# Patient Record
Sex: Male | Born: 1990 | Race: Black or African American | Hispanic: No | Marital: Single | Smoking: Never smoker
Health system: Southern US, Community
[De-identification: ages and names within clinical notes are randomized; demographics above are authoritative.]

## PROBLEM LIST (undated history)

## (undated) DIAGNOSIS — R011 Cardiac murmur, unspecified: Secondary | ICD-10-CM

## (undated) HISTORY — DX: Cardiac murmur, unspecified: R01.1

---
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
## 2008-08-06 ENCOUNTER — Emergency Department (HOSPITAL_COMMUNITY): Admission: EM | Admit: 2008-08-06 | Discharge: 2008-08-06 | Payer: Self-pay | Admitting: Emergency Medicine

## 2009-03-10 ENCOUNTER — Emergency Department (HOSPITAL_COMMUNITY): Admission: EM | Admit: 2009-03-10 | Discharge: 2009-03-10 | Payer: Self-pay | Admitting: Emergency Medicine

## 2009-08-22 ENCOUNTER — Emergency Department (HOSPITAL_COMMUNITY): Admission: EM | Admit: 2009-08-22 | Discharge: 2009-08-23 | Payer: Self-pay | Admitting: Emergency Medicine

## 2009-08-29 ENCOUNTER — Emergency Department (HOSPITAL_COMMUNITY): Admission: EM | Admit: 2009-08-29 | Discharge: 2009-08-30 | Payer: Self-pay | Admitting: Emergency Medicine

## 2009-10-26 ENCOUNTER — Emergency Department (HOSPITAL_COMMUNITY): Admission: EM | Admit: 2009-10-26 | Discharge: 2009-10-27 | Payer: Self-pay | Admitting: Emergency Medicine

## 2012-09-01 ENCOUNTER — Ambulatory Visit (INDEPENDENT_AMBULATORY_CARE_PROVIDER_SITE_OTHER): Payer: Managed Care, Other (non HMO) | Admitting: Orthopedic Surgery

## 2012-09-01 ENCOUNTER — Encounter: Payer: Self-pay | Admitting: Orthopedic Surgery

## 2012-09-01 ENCOUNTER — Ambulatory Visit (INDEPENDENT_AMBULATORY_CARE_PROVIDER_SITE_OTHER): Payer: Managed Care, Other (non HMO)

## 2012-09-01 VITALS — BP 120/78 | Ht 73.0 in | Wt 182.0 lb

## 2012-09-01 DIAGNOSIS — M25519 Pain in unspecified shoulder: Secondary | ICD-10-CM

## 2012-09-01 DIAGNOSIS — M25512 Pain in left shoulder: Secondary | ICD-10-CM

## 2012-09-01 MED ORDER — HYDROCODONE-ACETAMINOPHEN 5-325 MG PO TABS: 1.0000 | ORAL_TABLET | Freq: Four times a day (QID) | ORAL | Status: DC | PRN

## 2012-09-01 MED ORDER — IBUPROFEN 800 MG PO TABS: 800.0000 mg | ORAL_TABLET | Freq: Three times a day (TID) | ORAL | Status: DC

## 2012-09-01 MED ORDER — GABAPENTIN 100 MG PO CAPS: 100.0000 mg | ORAL_CAPSULE | Freq: Every day | ORAL | Status: DC

## 2012-09-01 NOTE — Progress Notes (Signed)
Patient ID: Jeremy Levine, male   DOB: 1990/12/07, 22 y.o.   MRN: [id number] Chief Complaint  Patient presents with  . Shoulder Pain    left shoulder pain. Consult by Dr. Ladona Ridgel   Pain located: left side of back and shoulder. Started 1 year ago Onset gradual Treatment 2 months of chiropractic therapy Symptoms Sharp, dull, throbbing Pain rating a 10 Pain is constant Improved by nothing Worse with use of left arm Other symptoms reported bruising, numbness, locking, catching, swelling  The past, family history and social history have been reviewed and are recorded in the corresponding sections of epic   BP 120/78  Ht 6\' 1"  (1.854 m)  Wt 182 lb (82.555 kg)  BMI 24.02 kg/m2 General appearance is normal, the patient is alert and oriented x3 with normal mood and affect. Ambulation is normal. The patient's body habitus is muscular.  Right shoulder is tender over the superior angle of the scapula. He stable in abduction external rotation his rotator cuff strength is grade 5 he has normal passive range of motion he may have some mild winging of the inferior border of the scapula. There is some tenderness in the cervical spine but no loss of motion. Skin is without laceration pulses are normal lymph nodes are negative sensation is intact no pathologic reflexes  X-rays are normal  I reviewed Dr. Lubertha Basque notes  He has a trigger point  Recommend injection.  Recommend hydrocodone, gabapentin, ibuprofen, return 2 weeks to repeat injection if needed  Encounter Diagnoses  Name Primary?  . Left shoulder pain   . Trigger point of left shoulder region Yes

## 2012-09-01 NOTE — Patient Instructions (Addendum)
You Can Quit Smoking If you are ready to quit smoking or are thinking about it, congratulations! You have chosen to help yourself be healthier and live longer! There are lots of different ways to quit smoking. Nicotine gum, nicotine patches, a nicotine inhaler, or nicotine nasal spray can help with physical craving. Hypnosis, support groups, and medicines help break the habit of smoking. TIPS TO GET OFF AND STAY OFF CIGARETTES  Learn to predict your moods. Do not let a bad situation be your excuse to have a cigarette. Some situations in your life might tempt you to have a cigarette.  Ask friends and co-workers not to smoke around you.  Make your home smoke-free.  Never have "just one" cigarette. It leads to wanting another and another. Remind yourself of your decision to quit.  On a card, make a list of your reasons for not smoking. Read it at least the same number of times a day as you have a cigarette. Tell yourself everyday, "I do not want to smoke. I choose not to smoke."  Ask someone at home or work to help you with your plan to quit smoking.  Have something planned after you eat or have a cup of coffee. Take a walk or get other exercise to perk you up. This will help to keep you from overeating.  Try a relaxation exercise to calm you down and decrease your stress. Remember, you may be tense and nervous the first two weeks after you quit. This will pass.  Find new activities to keep your hands busy. Play with a pen, coin, or rubber band. Doodle or draw things on paper.  Brush your teeth right after eating. This will help cut down the craving for the taste of tobacco after meals. You can try mouthwash too.  Try gum, breath mints, or diet candy to keep something in your mouth. IF YOU SMOKE AND WANT TO QUIT:  Do not stock up on cigarettes. Never buy a carton. Wait until one pack is finished before you buy another.  Never carry cigarettes with you at work or at home.  Keep cigarettes  as far away from you as possible. Leave them with someone else.  Never carry matches or a lighter with you.  Ask yourself, "Do I need this cigarette or is this just a reflex?"  Bet with someone that you can quit. Put cigarette money in a piggy bank every morning. If you smoke, you give up the money. If you do not smoke, by the end of the week, you keep the money.  Keep trying. It takes 21 days to change a habit!  Talk to your doctor about using medicines to help you quit. These include nicotine replacement gum, lozenges, or skin patches. Document Released: 11/23/2008 Document Revised: 04/21/2011 Document Reviewed: 11/23/2008 [employer] Patient Information 2014 [employer].  

## 2012-09-16 ENCOUNTER — Ambulatory Visit: Payer: Managed Care, Other (non HMO) | Admitting: Orthopedic Surgery

## 2012-09-30 ENCOUNTER — Ambulatory Visit: Payer: Managed Care, Other (non HMO) | Admitting: Orthopedic Surgery

## 2012-10-05 ENCOUNTER — Encounter: Payer: Self-pay | Admitting: Orthopedic Surgery

## 2012-10-05 ENCOUNTER — Ambulatory Visit (INDEPENDENT_AMBULATORY_CARE_PROVIDER_SITE_OTHER): Payer: Managed Care, Other (non HMO) | Admitting: Orthopedic Surgery

## 2012-10-05 VITALS — BP 117/75 | Ht 73.0 in | Wt 182.0 lb

## 2012-10-05 DIAGNOSIS — M25519 Pain in unspecified shoulder: Secondary | ICD-10-CM

## 2012-10-05 DIAGNOSIS — M25512 Pain in left shoulder: Secondary | ICD-10-CM

## 2012-10-05 MED ORDER — HYDROCODONE-ACETAMINOPHEN 5-325 MG PO TABS: 1.0000 | ORAL_TABLET | Freq: Four times a day (QID) | ORAL | Status: DC | PRN

## 2012-10-05 NOTE — Patient Instructions (Signed)
You have received a steroid shot. 15% of patients experience increased pain at the injection site with in the next 24 hours. This is best treated with ice and tylenol extra strength 2 tabs every 8 hours. If you are still having pain please call the office.   Trigger point Trigger Point Injection Trigger points are areas where you have muscle pain. A trigger point injection is a shot given in the trigger point to relieve that pain. A trigger point might feel like a knot in your muscle. It hurts to press on a trigger point. Sometimes the pain spreads out (radiates) to other parts of the body. For example, pressing on a trigger point in your shoulder might cause pain in your arm or neck. You might have one trigger point. Or, you might have more than one. People often have trigger points in their upper back and lower back. They also occur often in the neck and shoulders. Pain from a trigger point lasts for a long time. It can make it hard to keep moving. You might not be able to do the exercise or physical therapy that could help you deal with the pain. A trigger point injection may help. It does not work for everyone. But, it may relieve your pain for a few days or a few months. A trigger point injection does not cure long-lasting (chronic) pain. LET YOUR CAREGIVER KNOW ABOUT:  Any allergies (especially to latex, lidocaine, or steroids).  Blood-thinning medicines that you take. These drugs can lead to bleeding or bruising after an injection. They include:  Aspirin.  Ibuprofen.  Clopidogrel.  Warfarin.  Other medicines you take. This includes all vitamins, herbs, eyedrops, over-the-counter medicines, and creams.  Use of steroids.  Recent infections.  Past problems with numbing medicines.  Bleeding problems.  Surgeries you have had.  Other health problems. RISKS AND COMPLICATIONS A trigger point injection is a safe treatment. However, problems may develop, such as:  Minor side effects  usually go away in 1 to 2 days. These may include:  Soreness.  Bruising.  Stiffness.  More serious problems are rare. But, they may include:  Bleeding under the skin (hematoma).  Skin infection.  Breaking off of the needle under your skin.  Lung puncture.  The trigger point injection may not work for you. BEFORE THE PROCEDURE You may need to stop taking any medicine that thins your blood. This is to prevent bleeding and bruising. Usually these medicines are stopped several days before the injection. No other preparation is needed. PROCEDURE  A trigger point injection can be given in your caregiver's office or in a clinic. Each injection takes 2 minutes or less.  Your caregiver will feel for trigger points. The caregiver may use a marker to circle the area for the injection.  The skin over the trigger point will be washed with a germ-killing (antiseptic) solution.  The caregiver pinches the spot for the injection.  Then, a very thin needle is used for the shot. You may feel pain or a twitching feeling when the needle enters the trigger point.  A numbing solution may be injected into the trigger point. Sometimes a drug to keep down swelling, redness, and warmth (inflammation) is also injected.  Your caregiver moves the needle around the trigger zone until the tightness and twitching goes away.  After the injection, your caregiver may put gentle pressure over the injection site.  Then it is covered with a bandage. AFTER THE PROCEDURE  You can go  right home after the injection.  The bandage can be taken off after a few hours.  You may feel sore and stiff for 1 to 2 days.  Go back to your regular activities slowly. Your caregiver may ask you to stretch your muscles. Do not do anything that takes extra energy for a few days.  Follow your caregiver's instructions to manage and treat other pain. Document Released: 01/16/2011 Document Revised: 04/21/2011 Document Reviewed:  01/16/2011 [facility] Patient Information 2014 [place], [place].

## 2012-10-05 NOTE — Progress Notes (Signed)
Patient ID: Jeremy Levine, male   DOB: 1990/02/23, 22 y.o.   MRN: [id number]  Chief Complaint  Patient presents with  . Follow-up    2 week recheck for trigger point left shoulder.    Repeat examination in evaluation for pain at the superior border and angle of the left scapula with some radiation into the cervical spine and left trapezius muscle. The patient did get relief for 2 weeks with the injection comes back for reevaluation with recurrent pain  Denies shoulder pain or pain and numbness or tingling in the left arm  General appearance is normal, the patient is alert and oriented x3 with normal mood and affect. BP 117/75  Ht 6\' 1"  (1.854 m)  Wt 182 lb (82.555 kg)  BMI 24.02 kg/m2  Tender in the left tract and left superior border of the scapula at the angle. Shoulder range of motion neck range of motion normal no tenderness in the C-spine  Repeat trigger point injection  Verbal consent time out to confirm surgical site:  Left shoulder superior angle of the scapula point of maximal tenderness prepped with alcohol and ethyl chloride injected with Depo-Medrol and lidocaine. Meds 40 mg of Depo-Medrol and lidocaine 1% 3 cc  Continue current medications Norco refilled followup 2-3 weeks

## 2012-10-21 ENCOUNTER — Ambulatory Visit (INDEPENDENT_AMBULATORY_CARE_PROVIDER_SITE_OTHER): Payer: Managed Care, Other (non HMO) | Admitting: Orthopedic Surgery

## 2012-10-21 VITALS — BP 125/75 | Ht 73.0 in | Wt 182.0 lb

## 2012-10-21 DIAGNOSIS — M25519 Pain in unspecified shoulder: Secondary | ICD-10-CM

## 2012-10-21 DIAGNOSIS — M25512 Pain in left shoulder: Secondary | ICD-10-CM

## 2012-10-21 MED ORDER — HYDROCODONE-ACETAMINOPHEN 5-325 MG PO TABS: 1.0000 | ORAL_TABLET | Freq: Four times a day (QID) | ORAL | Status: DC | PRN

## 2012-10-21 NOTE — Patient Instructions (Addendum)
You have received a steroid shot. 15% of patients experience increased pain at the injection site with in the next 24 hours. This is best treated with ice and tylenol extra strength 2 tabs every 8 hours. If you are still having pain please call the office.    

## 2012-10-21 NOTE — Progress Notes (Signed)
Patient ID: Jeremy Levine, male   DOB: 11/13/1990, 23 y.o.   MRN: [id number] Chief Complaint  Patient presents with  . Follow-up    2 week recheck left shoulder    The pain is now localized to the left trapezius muscle.  No pain with activities related to the shoulder itself  General appearance is normal, the patient is alert and oriented x3 with normal mood and affect.   BP 125/75  Ht 6\' 1"  (1.854 m)  Wt 182 lb (82.555 kg)  BMI 24.02 kg/m2 For elevation remains normal strength is excellent he has tenderness over the trapezius muscle in the suprascapular fossa without numbness or tingling axillary nodes are negative supraclavicular nodes are negative  Trigger point injection left trapezius muscle. DX trigger point Med Depo-Medrol 40 mg per mL, 1 cc         Lidocaine 1% 3 cc  Alcohol and ethyl chloride were used to prep Verbal consent and timeout to confirm surgical site   The point of maximal tenderness was injected with 25-gauge needle no complications

## 2012-11-04 ENCOUNTER — Encounter: Payer: Self-pay | Admitting: Orthopedic Surgery

## 2012-11-04 ENCOUNTER — Ambulatory Visit: Payer: Managed Care, Other (non HMO) | Admitting: Orthopedic Surgery

## 2012-12-14 ENCOUNTER — Encounter: Payer: Self-pay | Admitting: Orthopedic Surgery

## 2012-12-14 ENCOUNTER — Ambulatory Visit: Payer: Managed Care, Other (non HMO) | Admitting: Orthopedic Surgery

## 2017-12-09 ENCOUNTER — Emergency Department (HOSPITAL_COMMUNITY)
Admission: EM | Admit: 2017-12-09 | Discharge: 2017-12-10 | Payer: No Typology Code available for payment source | Attending: Emergency Medicine | Admitting: Emergency Medicine

## 2017-12-09 ENCOUNTER — Encounter (HOSPITAL_COMMUNITY): Payer: Self-pay | Admitting: Emergency Medicine

## 2017-12-09 ENCOUNTER — Emergency Department (HOSPITAL_COMMUNITY): Payer: No Typology Code available for payment source

## 2017-12-09 ENCOUNTER — Other Ambulatory Visit: Payer: Self-pay

## 2017-12-09 DIAGNOSIS — S0181XA Laceration without foreign body of other part of head, initial encounter: Secondary | ICD-10-CM | POA: Diagnosis not present

## 2017-12-09 DIAGNOSIS — Y999 Unspecified external cause status: Secondary | ICD-10-CM | POA: Insufficient documentation

## 2017-12-09 DIAGNOSIS — F17210 Nicotine dependence, cigarettes, uncomplicated: Secondary | ICD-10-CM | POA: Insufficient documentation

## 2017-12-09 DIAGNOSIS — S43005A Unspecified dislocation of left shoulder joint, initial encounter: Secondary | ICD-10-CM | POA: Insufficient documentation

## 2017-12-09 DIAGNOSIS — S0101XA Laceration without foreign body of scalp, initial encounter: Secondary | ICD-10-CM | POA: Insufficient documentation

## 2017-12-09 DIAGNOSIS — Y9389 Activity, other specified: Secondary | ICD-10-CM | POA: Insufficient documentation

## 2017-12-09 DIAGNOSIS — Z23 Encounter for immunization: Secondary | ICD-10-CM | POA: Diagnosis not present

## 2017-12-09 DIAGNOSIS — Y929 Unspecified place or not applicable: Secondary | ICD-10-CM | POA: Diagnosis not present

## 2017-12-09 LAB — CBC WITH DIFFERENTIAL/PLATELET
ABS IMMATURE GRANULOCYTES: 0.07 10*3/uL (ref 0.00–0.07)
Basophils Absolute: 0.1 10*3/uL (ref 0.0–0.1)
Basophils Relative: 1 %
EOS PCT: 2 %
Eosinophils Absolute: 0.3 10*3/uL (ref 0.0–0.5)
HEMATOCRIT: 40.8 % (ref 39.0–52.0)
HEMOGLOBIN: 12.5 g/dL — AB (ref 13.0–17.0)
Immature Granulocytes: 1 %
LYMPHS ABS: 3.6 10*3/uL (ref 0.7–4.0)
LYMPHS PCT: 25 %
MCH: 26.1 pg (ref 26.0–34.0)
MCHC: 30.6 g/dL (ref 30.0–36.0)
MCV: 85.2 fL (ref 80.0–100.0)
MONO ABS: 0.9 10*3/uL (ref 0.1–1.0)
MONOS PCT: 6 %
NEUTROS ABS: 9.4 10*3/uL — AB (ref 1.7–7.7)
NEUTROS PCT: 65 %
Platelets: 352 10*3/uL (ref 150–400)
RBC: 4.79 MIL/uL (ref 4.22–5.81)
RDW: 12 % (ref 11.5–15.5)
WBC: 14.2 10*3/uL — AB (ref 4.0–10.5)
nRBC: 0 % (ref 0.0–0.2)

## 2017-12-09 LAB — BASIC METABOLIC PANEL
Anion gap: 7 (ref 5–15)
BUN: 7 mg/dL (ref 6–20)
CHLORIDE: 105 mmol/L (ref 98–111)
CO2: 25 mmol/L (ref 22–32)
Calcium: 9.5 mg/dL (ref 8.9–10.3)
Creatinine, Ser: 1.24 mg/dL (ref 0.61–1.24)
GFR calc Af Amer: >60 mL/min (ref >60)
GFR calc non Af Amer: >60 mL/min (ref >60)
Glucose, Bld: 165 mg/dL — ABNORMAL HIGH (ref 70–99)
POTASSIUM: 3.3 mmol/L — AB (ref 3.5–5.1)
SODIUM: 137 mmol/L (ref 135–145)

## 2017-12-09 MED ORDER — LIDOCAINE-EPINEPHRINE (PF) 2 %-1:200000 IJ SOLN
20.0000 mL | Freq: Once | INTRAMUSCULAR | Status: DC
  Filled 2017-12-09: qty 20

## 2017-12-09 MED ORDER — FENTANYL CITRATE (PF) 100 MCG/2ML IJ SOLN
50.0000 ug | Freq: Once | INTRAMUSCULAR | Status: DC
  Administered 2017-12-09: 50 ug via INTRAVENOUS
  Filled 2017-12-09: qty 2

## 2017-12-09 MED ORDER — PROPOFOL 10 MG/ML IV BOLUS
0.5000 mg/kg | Freq: Once | INTRAVENOUS | Status: DC
  Filled 2017-12-09: qty 20

## 2017-12-09 MED ORDER — TETANUS-DIPHTH-ACELL PERTUSSIS 5-2.5-18.5 LF-MCG/0.5 IM SUSP
0.5000 mL | Freq: Once | INTRAMUSCULAR | Status: AC
  Administered 2017-12-09: 0.5 mL via INTRAMUSCULAR
  Filled 2017-12-09: qty 0.5

## 2017-12-09 MED ORDER — LORAZEPAM 2 MG/ML IJ SOLN
1.0000 mg | Freq: Once | INTRAMUSCULAR | Status: AC
  Administered 2017-12-09: 1 mg via INTRAVENOUS
  Filled 2017-12-09: qty 1

## 2017-12-09 MED ORDER — HYDROMORPHONE HCL 1 MG/ML IJ SOLN
1.0000 mg | Freq: Once | INTRAMUSCULAR | Status: AC
  Administered 2017-12-09: 1 mg via INTRAVENOUS
  Filled 2017-12-09: qty 1
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
## 2017-12-10 ENCOUNTER — Emergency Department (HOSPITAL_COMMUNITY): Payer: No Typology Code available for payment source

## 2017-12-10 MED ORDER — CEPHALEXIN 500 MG PO CAPS: 500.0000 mg | ORAL_CAPSULE | Freq: Three times a day (TID) | ORAL | 0 refills | Status: DC

## 2017-12-10 MED ORDER — IBUPROFEN 800 MG PO TABS: 800.0000 mg | ORAL_TABLET | Freq: Three times a day (TID) | ORAL | 0 refills | Status: DC | PRN

## 2017-12-10 MED ORDER — BACITRACIN ZINC 500 UNIT/GM EX OINT: TOPICAL_OINTMENT | Freq: Once | CUTANEOUS | Status: DC

## 2017-12-10 MED ORDER — PROPOFOL 10 MG/ML IV BOLUS
INTRAVENOUS | Status: AC | PRN
  Administered 2017-12-10: 50 mg via INTRAVENOUS
  Administered 2017-12-10: 30 mg via INTRAVENOUS
  Administered 2017-12-10: 20 mg via INTRAVENOUS
  Administered 2017-12-10: 50 mg via INTRAVENOUS

## 2017-12-10 NOTE — ED Notes (Signed)
  Patient tolerated PO challenge and standing up at the bedside.  Patient was placed in blue scrubs and L shoulder sling.  Patient belongings given to [facility] for transport.

## 2017-12-10 NOTE — ED Provider Notes (Signed)
.Sedation Date/Time: 12/10/2017 10:49 AM Performed by: Heide Scales, MD Authorized by: Heide Scales, MD   Consent:    Consent obtained:  Verbal   Consent given by:  Patient   Risks discussed:  Allergic reaction, dysrhythmia, inadequate sedation, nausea, prolonged hypoxia resulting in organ damage, prolonged sedation necessitating reversal, respiratory compromise necessitating ventilatory assistance and intubation and vomiting   Alternatives discussed:  Analgesia without sedation and anxiolysis Universal protocol:    Procedure explained and questions answered to patient or proxy's satisfaction: yes     Relevant documents present and verified: yes     Test results available and properly labeled: yes     Imaging studies available: yes     Required blood products, implants, devices, and special equipment available: yes     Site/side marked: yes     Immediately prior to procedure a time out was called: yes     Patient identity confirmation method:  Verbally with patient Indications:    Procedure performed:  Dislocation reduction   Procedure necessitating sedation performed by:  Physician performing sedation Pre-sedation assessment:    Time since last food or drink:  Over 1 hour   ASA classification: class 1 - normal, healthy patient     Neck mobility: normal     Mouth opening:  3 or more finger widths   Thyromental distance:  4 finger widths   Mallampati score:  I - soft palate, uvula, fauces, pillars visible   Pre-sedation assessments completed and reviewed: airway patency, cardiovascular function, hydration status, mental status, nausea/vomiting, pain level, respiratory function and temperature   Immediate pre-procedure details:    Reassessment: Patient reassessed immediately prior to procedure     Reviewed: vital signs, relevant labs/tests and NPO status     Verified: bag valve mask available, emergency equipment available, intubation equipment available, IV patency  confirmed, oxygen available and suction available   Procedure details (see MAR for exact dosages):    Preoxygenation:  Nasal cannula   Sedation:  Propofol   Analgesia:  Hydromorphone   Intra-procedure monitoring:  Blood pressure monitoring, cardiac monitor, continuous pulse oximetry, frequent LOC assessments, frequent vital sign checks and continuous capnometry   Intra-procedure events: none     Total Provider sedation time (minutes):  30 Post-procedure details:    Attendance: Constant attendance by certified staff until patient recovered     Recovery: Patient returned to pre-procedure baseline     Post-sedation assessments completed and reviewed: airway patency, cardiovascular function, hydration status, mental status, nausea/vomiting, pain level, respiratory function and temperature     Patient is stable for discharge or admission: yes     Patient tolerance:  Tolerated well, no immediate complications Reduction of dislocation Date/Time: 12/10/2017 10:51 AM Performed by: Heide Scales, MD Authorized by: Heide Scales, MD  Consent: Written consent obtained. Risks and benefits: risks, benefits and alternatives were discussed Consent given by: patient Patient understanding: patient states understanding of the procedure being performed Patient consent: the patient's understanding of the procedure matches consent given Procedure consent: procedure consent matches procedure scheduled Relevant documents: relevant documents present and verified Test results: test results available and properly labeled Imaging studies: imaging studies available Patient identity confirmed: verbally with patient and arm band Time out: Immediately prior to procedure a "time out" was called to verify the correct patient, procedure, equipment, support staff and site/side marked as required.  Sedation: Patient sedated: yes Vitals: Vital signs were monitored during sedation.  Patient tolerance:  Patient tolerated the procedure  well with no immediate complications Comments: Successful shoulder reduction with propofol sedation.         Soffia Doshier, Canary Brim, MD 12/10/17 1051
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
## 2017-12-10 NOTE — Discharge Instructions (Addendum)
It was my pleasure taking care of you today!   Keep your wounds clean and dry. Follow up with the plastic surgery team for further evaluation of your lacerations. You will need to have these stitches removed. If you cannot follow up with the plastics team for any reason, return to ER in 7 days for suture / staple removal.   Please take all of your antibiotics until finished!  This is to prevent infection.   Return to ER for new or worsening symptoms, any additional concerns.

## 2017-12-23 ENCOUNTER — Emergency Department (HOSPITAL_COMMUNITY)
Admission: EM | Admit: 2017-12-23 | Discharge: 2017-12-23 | Payer: Self-pay | Attending: Emergency Medicine | Admitting: Emergency Medicine

## 2017-12-23 ENCOUNTER — Other Ambulatory Visit: Payer: Self-pay

## 2017-12-23 ENCOUNTER — Ambulatory Visit (INDEPENDENT_AMBULATORY_CARE_PROVIDER_SITE_OTHER): Payer: Self-pay

## 2017-12-23 ENCOUNTER — Emergency Department (HOSPITAL_COMMUNITY): Payer: Self-pay

## 2017-12-23 ENCOUNTER — Encounter (INDEPENDENT_AMBULATORY_CARE_PROVIDER_SITE_OTHER): Payer: Self-pay | Admitting: Orthopedic Surgery

## 2017-12-23 ENCOUNTER — Ambulatory Visit (INDEPENDENT_AMBULATORY_CARE_PROVIDER_SITE_OTHER): Payer: Self-pay | Admitting: Orthopedic Surgery

## 2017-12-23 DIAGNOSIS — M25512 Pain in left shoulder: Secondary | ICD-10-CM

## 2017-12-23 DIAGNOSIS — F17200 Nicotine dependence, unspecified, uncomplicated: Secondary | ICD-10-CM | POA: Insufficient documentation

## 2017-12-23 DIAGNOSIS — S43015D Anterior dislocation of left humerus, subsequent encounter: Secondary | ICD-10-CM | POA: Insufficient documentation

## 2017-12-23 DIAGNOSIS — X500XXD Overexertion from strenuous movement or load, subsequent encounter: Secondary | ICD-10-CM | POA: Insufficient documentation

## 2017-12-23 DIAGNOSIS — S43015A Anterior dislocation of left humerus, initial encounter: Secondary | ICD-10-CM

## 2017-12-23 MED ORDER — SODIUM CHLORIDE 0.9 % IV BOLUS
1000.0000 mL | Freq: Once | INTRAVENOUS | Status: AC
  Administered 2017-12-23: 1000 mL via INTRAVENOUS

## 2017-12-23 MED ORDER — IBUPROFEN 800 MG PO TABS: 800.0000 mg | ORAL_TABLET | Freq: Three times a day (TID) | ORAL | 0 refills | Status: DC | PRN

## 2017-12-23 MED ORDER — PROPOFOL 10 MG/ML IV BOLUS
1.0000 mg/kg | Freq: Once | INTRAVENOUS | Status: AC
  Administered 2017-12-23: 95.3 mg via INTRAVENOUS
  Filled 2017-12-23: qty 20

## 2017-12-23 MED ORDER — LORAZEPAM 2 MG/ML IJ SOLN
1.0000 mg | Freq: Once | INTRAMUSCULAR | Status: AC
  Administered 2017-12-23: 1 mg via INTRAVENOUS
  Filled 2017-12-23: qty 1

## 2017-12-23 MED ORDER — IBUPROFEN 800 MG PO TABS
800.0000 mg | ORAL_TABLET | Freq: Once | ORAL | Status: AC
  Administered 2017-12-23: 800 mg via ORAL
  Filled 2017-12-23: qty 1

## 2017-12-23 MED ORDER — PROPOFOL 10 MG/ML IV BOLUS
1.0000 mg/kg | Freq: Once | INTRAVENOUS | Status: AC
  Administered 2017-12-23: 95.3 mg via INTRAVENOUS

## 2017-12-23 MED ORDER — PROPOFOL 10 MG/ML IV BOLUS
INTRAVENOUS | Status: AC
  Administered 2017-12-23: 95.3 mg via INTRAVENOUS
  Filled 2017-12-23: qty 20

## 2017-12-23 MED ORDER — HYDROMORPHONE HCL 1 MG/ML IJ SOLN
1.0000 mg | Freq: Once | INTRAMUSCULAR | Status: AC
  Administered 2017-12-23: 1 mg via INTRAVENOUS
  Filled 2017-12-23: qty 1

## 2017-12-23 MED ORDER — LORAZEPAM 2 MG/ML IJ SOLN: 1.0000 mg | Freq: Once | INTRAMUSCULAR | Status: DC

## 2017-12-23 NOTE — Progress Notes (Signed)
Conscience sedation procedure completed.  Pt's vitals remained stable.
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
## 2017-12-23 NOTE — ED Notes (Signed)
Patient transported to MRI 

## 2017-12-23 NOTE — Discharge Instructions (Signed)
Please call and follow up with orthopedist Dr. August Saucerean for result of shoulder MRI and for further management.  Take ibuprofen as needed for pain. Return if your condition worsen.  Wear sling as provided.
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
## 2017-12-23 NOTE — ED Notes (Signed)
Discharge instructions discussed with Pt. Pt verbalized understanding. Pt stable and ambulatory, leaving in police custody.
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
## 2017-12-23 NOTE — ED Notes (Signed)
Informed consent for left shoulder reduction signed and placed in medical records.

## 2017-12-23 NOTE — ED Provider Notes (Signed)
.  Sedation Date/Time: 12/23/2017 2:32 PM Performed by: Pricilla LovelessGoldston, Brainard Highfill, MD Authorized by: Pricilla LovelessGoldston, Kiera Hussey, MD   Consent:    Consent obtained:  Verbal   Consent given by:  Patient   Risks discussed:  Allergic reaction, dysrhythmia, inadequate sedation, nausea, prolonged hypoxia resulting in organ damage, prolonged sedation necessitating reversal, respiratory compromise necessitating ventilatory assistance and intubation and vomiting   Alternatives discussed:  Analgesia without sedation Universal protocol:    Procedure explained and questions answered to patient or proxy's satisfaction: yes     Relevant documents present and verified: yes     Test results available and properly labeled: yes     Imaging studies available: yes     Required blood products, implants, devices, and special equipment available: yes     Site/side marked: yes     Immediately prior to procedure a time out was called: yes     Patient identity confirmation method:  Verbally with patient Indications:    Procedure necessitating sedation performed by:  Physician performing sedation Pre-sedation assessment:    Time since last food or drink:  This AM   ASA classification: class 1 - normal, healthy patient     Neck mobility: normal     Mouth opening:  3 or more finger widths   Thyromental distance:  4 finger widths   Mallampati score:  I - soft palate, uvula, fauces, pillars visible   Pre-sedation assessments completed and reviewed: airway patency, cardiovascular function, hydration status, mental status, nausea/vomiting, pain level, respiratory function and temperature   Immediate pre-procedure details:    Reassessment: Patient reassessed immediately prior to procedure     Reviewed: vital signs, relevant labs/tests and NPO status     Verified: bag valve mask available, emergency equipment available, intubation equipment available, IV patency confirmed, oxygen available and suction available   Procedure details (see  MAR for exact dosages):    Preoxygenation:  Nasal cannula   Sedation:  Propofol   Intra-procedure monitoring:  Blood pressure monitoring, cardiac monitor, continuous pulse oximetry, frequent LOC assessments, frequent vital sign checks and continuous capnometry   Intra-procedure events: none     Total Provider sedation time (minutes):  12 Post-procedure details:    Attendance: Constant attendance by certified staff until patient recovered     Recovery: Patient returned to pre-procedure baseline     Post-sedation assessments completed and reviewed: airway patency, cardiovascular function, hydration status, mental status, nausea/vomiting, pain level, respiratory function and temperature     Patient is stable for discharge or admission: yes     Patient tolerance:  Tolerated well, no immediate complications    MDM  Patient with subacute dislocation for about 2 weeks.  Sedation performed and reduction performed.  He is neurovascular intact afterwards.       Pricilla LovelessGoldston, Azel Gumina, MD 12/23/17 [phone]

## 2017-12-23 NOTE — ED Notes (Signed)
Pt given sprite 

## 2017-12-23 NOTE — Progress Notes (Signed)
RT at bedside for conscience sedation procedure.  Pt placed on East  with ETCO2 by RN.  Airway equipment available at room door if needed.

## 2017-12-23 NOTE — ED Notes (Signed)
Pt ambulatory to bathroom, no reported issues.

## 2017-12-23 NOTE — ED Notes (Signed)
Respiratory at bedside.

## 2017-12-28 ENCOUNTER — Telehealth (INDEPENDENT_AMBULATORY_CARE_PROVIDER_SITE_OTHER): Payer: Self-pay

## 2017-12-28 NOTE — Telephone Encounter (Signed)
Please advise. Thanks.  

## 2017-12-28 NOTE — Telephone Encounter (Signed)
Alvino ChapelJo, Nurse at [facility] called concerning MRI results for patient.  Stated she will be there until 3:30pm. Stated that you can speak with the other nurse Sherrell after that time.  Cb# is [phone].  Fax# is [phone].  Please advise.  Thank you.

## 2017-12-28 NOTE — Telephone Encounter (Signed)
Hi Lauren.  Please send the fax report of the shoulder MRI results.  He basically needs beachchair arthroscopy with biceps tenodesis rotator cuff repair and anterior inferior labral repair.  He also needs to stay in the sling until he gets his surgery.  I did discuss this with the nurse.  If you could fax her the MRI results tomorrow that would be great.  Thanks

## 2017-12-29 NOTE — Telephone Encounter (Signed)
Faxed

## 2018-01-19 ENCOUNTER — Telehealth (INDEPENDENT_AMBULATORY_CARE_PROVIDER_SITE_OTHER): Payer: Self-pay

## 2018-01-19 NOTE — Telephone Encounter (Signed)
Needs surgery.  Please call thanks

## 2018-01-19 NOTE — Telephone Encounter (Signed)
Alvino ChapelJo at [facility] would like to know if patient needs to schedule surgery for his shoulder or does he need a F/U appointment?  Cb# is [phone].  Please advise.  Thank You.

## 2018-01-20 ENCOUNTER — Inpatient Hospital Stay (INDEPENDENT_AMBULATORY_CARE_PROVIDER_SITE_OTHER): Payer: Self-pay | Admitting: Family Medicine

## 2018-01-20 ENCOUNTER — Telehealth (INDEPENDENT_AMBULATORY_CARE_PROVIDER_SITE_OTHER): Payer: Self-pay | Admitting: Orthopedic Surgery

## 2018-01-20 ENCOUNTER — Other Ambulatory Visit (INDEPENDENT_AMBULATORY_CARE_PROVIDER_SITE_OTHER): Payer: Self-pay | Admitting: Orthopedic Surgery

## 2018-01-20 DIAGNOSIS — S43015A Anterior dislocation of left humerus, initial encounter: Secondary | ICD-10-CM
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
## 2018-01-20 NOTE — Progress Notes (Signed)
Pre-op instructions faxed to pt nurse Alvino ChapelJo, LPN with [facility]. Nurse made aware that pt must stop taking Aspirin, vitamins, fish oil, and herbal medications. Do not take any NSAIDs ie: Ibuprofen, Advil, Naproxen (Aleve), Motrin, BC and Goody Powder. Nurse confirmed receipt of fax and verbalized understanding of all pre-op instructions. Please complete pt assessment on DOS.

## 2018-01-20 NOTE — Pre-Procedure Instructions (Signed)
    Jeremy [name]  01/20/2018     No Pharmacies Listed   Your procedure is scheduled on Thursday, January 21, 2018  Report [facility] Admitting at 11:20 A.M.  Call this number if you have problems the morning of surgery:  [phone]   Remember:  Do not eat or drink after midnight.    Take these medicines the morning of surgery with A SIP OF WATER: None    Do not wear jewelry, make-up or nail polish.  Do not wear lotions, powders, or perfumes, or deodorant.  Do not shave 48 hours prior to surgery.  Men may shave face and neck.  Do not bring valuables to the hospital.  [facility] is not responsible for any belongings or valuables.  Contacts, dentures or bridgework may not be worn into surgery.  Leave your suitcase in the car.  After surgery it may be brought to your room.  For patients admitted to the hospital, discharge time will be determined by your treatment team.  Patients discharged the day of surgery will not be allowed to drive home.  Please read over the following fact sheets that you were given.

## 2018-01-20 NOTE — Telephone Encounter (Signed)
Please advise 

## 2018-01-21 ENCOUNTER — Ambulatory Visit (HOSPITAL_COMMUNITY)
Admission: RE | Admit: 2018-01-21 | Discharge: 2018-01-21 | Disposition: A | Discharge status: Home / Self Care | Attending: Orthopedic Surgery | Admitting: Orthopedic Surgery

## 2018-01-21 ENCOUNTER — Ambulatory Visit (HOSPITAL_COMMUNITY): Admitting: Anesthesiology

## 2018-01-21 ENCOUNTER — Encounter (HOSPITAL_COMMUNITY)
Admission: RE | Disposition: A | Discharge status: Home / Self Care | Payer: Self-pay | Source: Home / Self Care | Attending: Orthopedic Surgery

## 2018-01-21 ENCOUNTER — Other Ambulatory Visit: Payer: Self-pay

## 2018-01-21 ENCOUNTER — Encounter (HOSPITAL_COMMUNITY): Payer: Self-pay

## 2018-01-21 DIAGNOSIS — X58XXXA Exposure to other specified factors, initial encounter: Secondary | ICD-10-CM | POA: Diagnosis not present

## 2018-01-21 DIAGNOSIS — S43082A Other subluxation of left shoulder joint, initial encounter: Secondary | ICD-10-CM | POA: Diagnosis not present

## 2018-01-21 DIAGNOSIS — S43005D Unspecified dislocation of left shoulder joint, subsequent encounter: Secondary | ICD-10-CM

## 2018-01-21 DIAGNOSIS — M75102 Unspecified rotator cuff tear or rupture of left shoulder, not specified as traumatic: Secondary | ICD-10-CM | POA: Insufficient documentation

## 2018-01-21 DIAGNOSIS — S46011D Strain of muscle(s) and tendon(s) of the rotator cuff of right shoulder, subsequent encounter: Secondary | ICD-10-CM | POA: Diagnosis not present

## 2018-01-21 DIAGNOSIS — M25312 Other instability, left shoulder: Secondary | ICD-10-CM | POA: Insufficient documentation

## 2018-01-21 DIAGNOSIS — Y929 Unspecified place or not applicable: Secondary | ICD-10-CM | POA: Diagnosis not present

## 2018-01-21 DIAGNOSIS — S46112D Strain of muscle, fascia and tendon of long head of biceps, left arm, subsequent encounter: Secondary | ICD-10-CM

## 2018-01-21 DIAGNOSIS — S46011A Strain of muscle(s) and tendon(s) of the rotator cuff of right shoulder, initial encounter: Secondary | ICD-10-CM

## 2018-01-21 DIAGNOSIS — S46112A Strain of muscle, fascia and tendon of long head of biceps, left arm, initial encounter: Secondary | ICD-10-CM

## 2018-01-21 DIAGNOSIS — S43432A Superior glenoid labrum lesion of left shoulder, initial encounter: Secondary | ICD-10-CM | POA: Diagnosis not present

## 2018-01-21 HISTORY — PX: SHOULDER ARTHROSCOPY WITH SUBACROMIAL DECOMPRESSION, ROTATOR CUFF REPAIR AND BICEP TENDON REPAIR: SHX5687

## 2018-01-21 LAB — HEMOGLOBIN: Hemoglobin: 12.9 g/dL — ABNORMAL LOW (ref 13.0–17.0)

## 2018-01-21 SURGERY — SHOULDER ARTHROSCOPY WITH SUBACROMIAL DECOMPRESSION, ROTATOR CUFF REPAIR AND BICEP TENDON REPAIR
Anesthesia: Regional | Site: Shoulder | Laterality: Left

## 2018-01-21 MED ORDER — PROPOFOL 10 MG/ML IV BOLUS
INTRAVENOUS | Status: AC
  Filled 2018-01-21: qty 20

## 2018-01-21 MED ORDER — DEXAMETHASONE SODIUM PHOSPHATE 10 MG/ML IJ SOLN
INTRAMUSCULAR | Status: DC | PRN
  Administered 2018-01-21: 10 mg via INTRAVENOUS

## 2018-01-21 MED ORDER — BUPIVACAINE LIPOSOME 1.3 % IJ SUSP
INTRAMUSCULAR | Status: DC | PRN
  Administered 2018-01-21: 10 mL via PERINEURAL

## 2018-01-21 MED ORDER — MIDAZOLAM HCL 2 MG/2ML IJ SOLN
INTRAMUSCULAR | Status: AC
  Administered 2018-01-21: 2 mg via INTRAVENOUS
  Filled 2018-01-21: qty 2

## 2018-01-21 MED ORDER — ROCURONIUM BROMIDE 50 MG/5ML IV SOSY
PREFILLED_SYRINGE | INTRAVENOUS | Status: AC
  Filled 2018-01-21: qty 5

## 2018-01-21 MED ORDER — FENTANYL CITRATE (PF) 250 MCG/5ML IJ SOLN
INTRAMUSCULAR | Status: AC
  Filled 2018-01-21: qty 5

## 2018-01-21 MED ORDER — EPINEPHRINE PF 1 MG/ML IJ SOLN
INTRAMUSCULAR | Status: DC | PRN
  Administered 2018-01-21: .5 mg

## 2018-01-21 MED ORDER — BUPIVACAINE HCL (PF) 0.5 % IJ SOLN
INTRAMUSCULAR | Status: DC | PRN
  Administered 2018-01-21: 15 mL via PERINEURAL

## 2018-01-21 MED ORDER — ROCURONIUM BROMIDE 10 MG/ML (PF) SYRINGE
PREFILLED_SYRINGE | INTRAVENOUS | Status: DC | PRN
  Administered 2018-01-21: 50 mg via INTRAVENOUS

## 2018-01-21 MED ORDER — CEFAZOLIN SODIUM 1 G IJ SOLR
INTRAMUSCULAR | Status: AC
  Filled 2018-01-21: qty 20

## 2018-01-21 MED ORDER — FENTANYL CITRATE (PF) 100 MCG/2ML IJ SOLN
INTRAMUSCULAR | Status: AC
  Filled 2018-01-21: qty 2

## 2018-01-21 MED ORDER — SODIUM CHLORIDE 0.9 % IV SOLN
INTRAVENOUS | Status: DC | PRN
  Administered 2018-01-21: 50 ug/min via INTRAVENOUS

## 2018-01-21 MED ORDER — DEXAMETHASONE SODIUM PHOSPHATE 10 MG/ML IJ SOLN
INTRAMUSCULAR | Status: AC
  Filled 2018-01-21: qty 1

## 2018-01-21 MED ORDER — 0.9 % SODIUM CHLORIDE (POUR BTL) OPTIME
TOPICAL | Status: DC | PRN
  Administered 2018-01-21: 2000 mL

## 2018-01-21 MED ORDER — SUGAMMADEX SODIUM 200 MG/2ML IV SOLN
INTRAVENOUS | Status: DC | PRN
  Administered 2018-01-21: 200 mg via INTRAVENOUS

## 2018-01-21 MED ORDER — ONDANSETRON HCL 4 MG/2ML IJ SOLN
INTRAMUSCULAR | Status: AC
  Filled 2018-01-21: qty 2

## 2018-01-21 MED ORDER — LIDOCAINE 2% (20 MG/ML) 5 ML SYRINGE
INTRAMUSCULAR | Status: DC | PRN
  Administered 2018-01-21: 60 mg via INTRAVENOUS

## 2018-01-21 MED ORDER — MIDAZOLAM HCL 2 MG/2ML IJ SOLN
2.0000 mg | Freq: Once | INTRAMUSCULAR | Status: AC
  Administered 2018-01-21: 2 mg via INTRAVENOUS

## 2018-01-21 MED ORDER — FENTANYL CITRATE (PF) 100 MCG/2ML IJ SOLN: 25.0000 ug | INTRAMUSCULAR | Status: DC | PRN

## 2018-01-21 MED ORDER — EPINEPHRINE PF 1 MG/ML IJ SOLN
INTRAMUSCULAR | Status: AC
  Filled 2018-01-21: qty 1

## 2018-01-21 MED ORDER — PROPOFOL 10 MG/ML IV BOLUS
INTRAVENOUS | Status: DC | PRN
  Administered 2018-01-21: 200 mg via INTRAVENOUS

## 2018-01-21 MED ORDER — BUPIVACAINE HCL (PF) 0.25 % IJ SOLN
INTRAMUSCULAR | Status: AC
  Filled 2018-01-21: qty 30

## 2018-01-21 MED ORDER — ONDANSETRON HCL 4 MG/2ML IJ SOLN: 4.0000 mg | Freq: Once | INTRAMUSCULAR | Status: DC | PRN

## 2018-01-21 MED ORDER — ESMOLOL HCL 100 MG/10ML IV SOLN
INTRAVENOUS | Status: AC
  Filled 2018-01-21: qty 20

## 2018-01-21 MED ORDER — LACTATED RINGERS IV SOLN: INTRAVENOUS | Status: DC

## 2018-01-21 MED ORDER — FENTANYL CITRATE (PF) 100 MCG/2ML IJ SOLN
INTRAMUSCULAR | Status: DC | PRN
  Administered 2018-01-21: 50 ug via INTRAVENOUS
  Administered 2018-01-21: 100 ug via INTRAVENOUS

## 2018-01-21 MED ORDER — PHENYLEPHRINE HCL 10 MG/ML IJ SOLN
INTRAMUSCULAR | Status: AC
  Filled 2018-01-21: qty 1

## 2018-01-21 MED ORDER — SODIUM CHLORIDE 0.9 % IR SOLN
Status: DC | PRN
  Administered 2018-01-21: 15000 mL

## 2018-01-21 MED ORDER — LIDOCAINE 2% (20 MG/ML) 5 ML SYRINGE
INTRAMUSCULAR | Status: AC
  Filled 2018-01-21: qty 5

## 2018-01-21 MED ORDER — ONDANSETRON HCL 4 MG/2ML IJ SOLN
INTRAMUSCULAR | Status: DC | PRN
  Administered 2018-01-21: 4 mg via INTRAVENOUS

## 2018-01-21 MED ORDER — CEFAZOLIN SODIUM-DEXTROSE 2-3 GM-%(50ML) IV SOLR
INTRAVENOUS | Status: DC | PRN
  Administered 2018-01-21: 2 g via INTRAVENOUS

## 2018-01-21 MED ORDER — LACTATED RINGERS IV SOLN
INTRAVENOUS | Status: DC | PRN
  Administered 2018-01-21 (×2): via INTRAVENOUS

## 2018-01-21 SURGICAL SUPPLY — 82 items
ANCHOR SUT BIOCOMP CORKSREW (Anchor) ×6 IMPLANT
ANCHOR SUT BIOCOMP LK 2.9X12.5 (Anchor) ×9 IMPLANT
ANCHOR SUT SWIVELLOK BIO (Anchor) ×3 IMPLANT
BLADE CUTTER GATOR 3.5 (BLADE) ×6 IMPLANT
BLADE GREAT WHITE 4.2 (BLADE) IMPLANT
BLADE GREAT WHITE 4.2MM (BLADE)
BLADE SURG 11 STRL SS (BLADE) IMPLANT
BUR OVAL 6.0 (BURR) IMPLANT
CANNULA 5.75X71 LONG (CANNULA) ×3 IMPLANT
CANNULA TWIST IN 8.25X7CM (CANNULA) ×3 IMPLANT
CLOSURE WOUND 1/2 X4 (GAUZE/BANDAGES/DRESSINGS) ×1
COVER SURGICAL LIGHT HANDLE (MISCELLANEOUS) ×3 IMPLANT
COVER WAND RF STERILE (DRAPES) ×3 IMPLANT
DRAPE INCISE IOBAN 66X45 STRL (DRAPES) ×6 IMPLANT
DRAPE STERI 35X30 U-POUCH (DRAPES) ×3 IMPLANT
DRAPE U-SHAPE 47X51 STRL (DRAPES) ×6 IMPLANT
DRSG TEGADERM 4X4.75 (GAUZE/BANDAGES/DRESSINGS) ×9 IMPLANT
DURAPREP 26ML APPLICATOR (WOUND CARE) ×3 IMPLANT
ELECT REM PT RETURN 9FT ADLT (ELECTROSURGICAL) ×3
ELECTRODE REM PT RTRN 9FT ADLT (ELECTROSURGICAL) ×1 IMPLANT
FIBERSTICK 2 (SUTURE) ×3 IMPLANT
FILTER STRAW FLUID ASPIR (MISCELLANEOUS) ×3 IMPLANT
GAUZE SPONGE 4X4 12PLY STRL (GAUZE/BANDAGES/DRESSINGS) ×3 IMPLANT
GAUZE SPONGE 4X4 12PLY STRL LF (GAUZE/BANDAGES/DRESSINGS) ×3 IMPLANT
GLOVE BIOGEL PI IND STRL 7.5 (GLOVE) ×1 IMPLANT
GLOVE BIOGEL PI IND STRL 8 (GLOVE) ×1 IMPLANT
GLOVE BIOGEL PI INDICATOR 7.5 (GLOVE) ×2
GLOVE BIOGEL PI INDICATOR 8 (GLOVE) ×2
GLOVE ECLIPSE 7.0 STRL STRAW (GLOVE) ×3 IMPLANT
GLOVE SURG ORTHO 8.0 STRL STRW (GLOVE) ×3 IMPLANT
GOWN STRL REUS W/ TWL LRG LVL3 (GOWN DISPOSABLE) ×3 IMPLANT
GOWN STRL REUS W/TWL LRG LVL3 (GOWN DISPOSABLE) ×6
KIT BASIN OR (CUSTOM PROCEDURE TRAY) ×3 IMPLANT
KIT PUSHLOCK 2.9 HIP (KITS) ×3 IMPLANT
KIT TURNOVER KIT B (KITS) ×3 IMPLANT
MANIFOLD NEPTUNE II (INSTRUMENTS) ×3 IMPLANT
NDL SUT 6 .5 CRC .975X.05 MAYO (NEEDLE) ×1 IMPLANT
NEEDLE HYPO 25X1 1.5 SAFETY (NEEDLE) IMPLANT
NEEDLE MAYO TAPER (NEEDLE) ×2
NEEDLE SCORPION MULTI FIRE (NEEDLE) ×3 IMPLANT
NEEDLE SPNL 18GX3.5 QUINCKE PK (NEEDLE) ×3 IMPLANT
NEEDLE SUT 2-0 SCORPION KNEE (NEEDLE) ×3 IMPLANT
NS IRRIG 1000ML POUR BTL (IV SOLUTION) ×3 IMPLANT
PACK SHOULDER (CUSTOM PROCEDURE TRAY) ×3 IMPLANT
PAD ARMBOARD 7.5X6 YLW CONV (MISCELLANEOUS) ×6 IMPLANT
PROBE BIPOLAR ATHRO 135MM 90D (MISCELLANEOUS) ×3 IMPLANT
PUSHLOCK PEEK 4.5X24 (Orthopedic Implant) ×6 IMPLANT
RESTRAINT HEAD UNIVERSAL NS (MISCELLANEOUS) ×3 IMPLANT
SET ARTHROSCOPY TUBING (MISCELLANEOUS) ×4
SET ARTHROSCOPY TUBING LN (MISCELLANEOUS) ×2 IMPLANT
SLING ARM IMMOBILIZER LRG (SOFTGOODS) IMPLANT
SPONGE LAP 18X18 RF (DISPOSABLE) ×6 IMPLANT
SPONGE LAP 4X18 RFD (DISPOSABLE) ×6 IMPLANT
STRIP CLOSURE SKIN 1/2X4 (GAUZE/BANDAGES/DRESSINGS) ×2 IMPLANT
SUCTION FRAZIER HANDLE 10FR (MISCELLANEOUS) ×2
SUCTION TUBE FRAZIER 10FR DISP (MISCELLANEOUS) ×1 IMPLANT
SUT 0 FIBERLOOP 38 BLUE TPR ND (SUTURE) ×3
SUT ETHILON 3 0 PS 1 (SUTURE) ×6 IMPLANT
SUT FIBERWIRE #2 38 T-5 BLUE (SUTURE)
SUT FIBERWIRE 2-0 18 17.9 3/8 (SUTURE) ×3
SUT MNCRL AB 3-0 PS2 18 (SUTURE) IMPLANT
SUT VIC AB 0 CT1 27 (SUTURE) ×2
SUT VIC AB 0 CT1 27XBRD ANBCTR (SUTURE) ×1 IMPLANT
SUT VIC AB 1 CT1 27 (SUTURE)
SUT VIC AB 1 CT1 27XBRD ANBCTR (SUTURE) IMPLANT
SUT VIC AB 2-0 CT1 27 (SUTURE) ×4
SUT VIC AB 2-0 CT1 TAPERPNT 27 (SUTURE) ×2 IMPLANT
SUT VICRYL 0 UR6 27IN ABS (SUTURE) ×15 IMPLANT
SUTURE 0 FIBERLP 38 BLU TPR ND (SUTURE) ×1 IMPLANT
SUTURE FIBERWR #2 38 T-5 BLUE (SUTURE) IMPLANT
SUTURE FIBERWR 2-0 18 17.9 3/8 (SUTURE) ×1 IMPLANT
SUTURE TAPE 1.3 FIBERLOP 20 ST (SUTURE) ×1 IMPLANT
SUTURE TAPE TIGERLINK 1.3MM BL (SUTURE) ×3 IMPLANT
SUTURETAPE 1.3 FIBERLOOP 20 ST (SUTURE) ×3
SUTURETAPE TIGERLINK 1.3MM BL (SUTURE) ×9
SYR 20CC LL (SYRINGE) ×6 IMPLANT
SYR 3ML LL SCALE MARK (SYRINGE) ×3 IMPLANT
SYR TB 1ML LUER SLIP (SYRINGE) IMPLANT
TOWEL OR 17X24 6PK STRL BLUE (TOWEL DISPOSABLE) ×3 IMPLANT
TOWEL OR 17X26 10 PK STRL BLUE (TOWEL DISPOSABLE) ×3 IMPLANT
WAND HAND CNTRL MULTIVAC 90 (MISCELLANEOUS) IMPLANT
WATER STERILE IRR 1000ML POUR (IV SOLUTION) ×3 IMPLANT

## 2018-01-21 NOTE — Telephone Encounter (Signed)
Yes will do pls calal htx

## 2018-01-21 NOTE — Op Note (Signed)
NAME: Jeremy Levine, Jeremy J. MEDICAL RECORD QM:[id number] ACCOUNT [id number] DATE OF BIRTH:[birth year] FACILITY: [facility] LOCATION: [facility] PHYSICIAN:GREGORY Diamantina ProvidenceS. DEAN, MD  OPERATIVE REPORT  DATE OF PROCEDURE:  01/21/2018  PREOPERATIVE DIAGNOSIS:  Left shoulder instability with labral tear, biceps tendon subluxation and rotator cuff tear.  POSTOPERATIVE DIAGNOSIS:  Left shoulder instability with labral tear, biceps tendon subluxation and rotator cuff tear.  PROCEDURE:  Left shoulder arthroscopy with biceps tendon release, limited superior labral debridement, arthroscopic labral repair using Arthrex 2.9 PushLock anchors x3 with mini-open rotator cuff repair using Arthrex corkscrews and push locks and biceps  tenodesis using SwiveLocks.  SURGEON:  Cammy CopaGregory Scott Dean, MD  ASSISTANT:  Melvyn NethJustin Clean, RNFA  INDICATIONS:  The patient is a [age] year old patient with left shoulder pain and instability who presents for operative management after explanation of risks and benefits.  He had shoulder instability and significant pathology of the rotator cuff and  labrum and biceps tendon by MRI scanning.  PROCEDURE IN DETAIL:  The patient was brought to the operating room where general endotracheal anesthesia was induced.  Preoperative antibiotics administered.  Timeout was called.  Left shoulder was examined under anesthesia and found to have 2+ anterior  instability, 1+ posterior instability and about less than a centimeter sulcus sign.  The patient was then placed in the beach-chair position with the head in neutral position.  Left arm was then prescrubbed with alcohol and Betadine, allowed to air dry.   Prepped with DuraPrep solution and draped in a sterile manner.  [name] was then used to cover the operative field, including the axilla.  Posterior portal was created 2 cm medial and inferior to the posterolateral margin of the acromion.  Diagnostic  arthroscopy was performed.  Anterior portal created under  direct visualization.  At this time, the patient did have labral tearing from about the 10 o'clock to 6 o'clock position.  There was a Hill-Sachs lesion.  Rotator cuff tear was also torn.   Supraspinatus with some delamination, and this measured about 2 x 2 cm.  Biceps tendon was released.  Superior labrum was then debrided.  There was also some synovitis within the rotator interval, which was debrided with the Arthrocare wand.  At this  time, the capsule labral junction was developed using an arthroscopic periosteal elevator.  The interface was prepared using a shaver.  Then, 3 anchors were placed at the 7 o'clock, 8 o'clock and 9 o'clock position using the knee scorpion to grasp the  tissue not more than 5 mm away from the glenoid articular surface.  The tissue was then reapproximated using the PushLock suture anchors.  This gave a nice reapproximation of the capsule labral tissue to the glenoid.  At this time, instruments were  removed and portals were closed using 2-0 Vicryl, 3-0 nylon.  Attention was then directed towards the rotator cuff tear and the biceps tendon.  Incision was made off the anterolateral margin of the acromion.  Raphae between the anterior and middle  deltoid was split and marked 4 cm with a #1 Vicryl suture.  Bursectomy was performed.  Biceps tendon was then tenodesed into the bicipital groove using Arthrex 6.5 SwiveLock suture anchor and 7 mm drill hole.  This was done under appropriate tension.  At  this time, attention was then directed towards the rotator cuff tear.  Rotator cuff tear was identified.  The footprint was prepared.  The grasping sutures were then placed, a 0 Vicryl suture, in the edge of the tendon.  Then, 2  corkscrews were placed  at the interface on the medial aspect of the footprint and the articular surface.  Eight suture tapes were passed using the scorpion suture passer, and then 1 closure closing suture 2-0 FiberWire was used to close up the apex of the  rotator cuff tear.   At this time, the suture tapes were tied and the ends were crossed.  Then incorporating the Vicryl tagging sutures, they were all incorporated in cross fashion into 2 PushLocks, which were then placed into the metaphyseal bone with good fixation  achieved.  At this time, the arm was taken through range of motion and found to have excellent range of motion.  Thorough irrigation was performed.  Deltoid split was closed using #1 Vicryl suture followed by interrupted inverted 0 Vicryl suture, 2-0  Vicryl suture and a 3-0 Monocryl.  Waterproof dressings were applied.  The patient tolerated the procedure well without immediate complications and transferred to the recovery room in stable condition.  LN/NUANCE  D:01/21/2018 T:01/21/2018 JOB:[id number]

## 2018-01-21 NOTE — Anesthesia Preprocedure Evaluation (Addendum)
Anesthesia Evaluation  Patient identified by MRN, date of birth, ID band Patient awake    Reviewed: Allergy & Precautions, NPO status , Patient's Chart, lab work & pertinent test results  Airway Mallampati: II  TM Distance: >3 FB Neck ROM: Full    Dental no notable dental hx. (+) Teeth Intact, Dental Advisory Given   Pulmonary neg pulmonary ROS,    Pulmonary exam normal breath sounds clear to auscultation       Cardiovascular negative cardio ROS Normal cardiovascular exam Rhythm:Regular Rate:Normal     Neuro/Psych negative neurological ROS  negative psych ROS   GI/Hepatic negative GI ROS, Neg liver ROS,   Endo/Other  negative endocrine ROS  Renal/GU negative Renal ROS     Musculoskeletal negative musculoskeletal ROS (+)   Abdominal   Peds  Hematology negative hematology ROS (+)   Anesthesia Other Findings left shoulder lateral dislocation of biceps tendon, slap tear  Reproductive/Obstetrics                           Anesthesia Physical Anesthesia Plan  ASA: I  Anesthesia Plan: General and Regional   Post-op Pain Management:    Induction: Intravenous  PONV Risk Score and Plan: 2 and Ondansetron, Dexamethasone, Midazolam and Treatment may vary due to age or medical condition  Airway Management Planned: Oral ETT  Additional Equipment:   Intra-op Plan:   Post-operative Plan: Extubation in OR  Informed Consent: I have reviewed the patients History and Physical, chart, labs and discussed the procedure including the risks, benefits and alternatives for the proposed anesthesia with the patient or authorized representative who has indicated his/her understanding and acceptance.   Dental advisory given  Plan Discussed with: CRNA  Anesthesia Plan Comments:        Anesthesia Quick Evaluation

## 2018-01-21 NOTE — Brief Op Note (Signed)
01/21/2018  01/21/2018  6:19 PM  PATIENT:  Jeremy Levine  27 y.o. male  PRE-OPERATIVE DIAGNOSIS:  left shoulder lateral dislocation of biceps tendon, slap tear  POST-OPERATIVE DIAGNOSIS:  left shoulder lateral dislocation of biceps tendon, slap tear  PROCEDURE:  Procedure(s): LEFT SHOULDER ARTHROSCOPY WITH ROTATOR CUFF TEAR REPAIR, BICEP TENODESIS, WITH SUPERIOR LABRUM DEBRIDEMENT & REPAIR of inferior labrum  SURGEON:  Surgeon(s): August Saucerean, Corrie MckusickGregory Scott, MD  ASSISTANT: J queen rnfa  ANESTHESIA:   general  EBL: 25 ml    Total I/O In: 1800 [I.V.:1800] Out: -   BLOOD ADMINISTERED: none  DRAINS: none   LOCAL MEDICATIONS USED:  none  SPECIMEN:  No Specimen  COUNTS:  YES  TOURNIQUET:  * No tourniquets in log *  DICTATION: .Other Dictation: Dictation Number [id number]  PLAN OF CARE: Discharge to home after PACU  PATIENT DISPOSITION:  PACU - hemodynamically stable

## 2018-01-21 NOTE — Telephone Encounter (Signed)
Ok thx.

## 2018-01-21 NOTE — H&P (Signed)
Jeremy Levine is an 27 y.o. male.   Chief Complaint: Left shoulder instability HPI: Jeremy Levine is a 27 year old patient with left shoulder instability.  He is currently a prisoner.  He initially sustained dislocation approximately 6 to 8 weeks ago.  It was relocated but then dislocated again.  He was subsequently relocated in the ER for a second time.  I saw him in clinic.  Deltoid was functional at that time.  MRI scan shows labral tear along with lateral subluxation of the biceps tendon and rotator cuff tearing.  He presents now for operative management after explanation of risks and benefits.  He has been in a sling since his injury.  Past Medical History:  Diagnosis Date  . Heart murmur     History reviewed. No pertinent surgical history.  History reviewed. No pertinent family history. Social History:  reports that he has never smoked. He has never used smokeless tobacco. He reports previous drug use. He reports that he does not drink alcohol.  Allergies: No Known Allergies  No medications prior to admission.    Results for orders placed or performed during the hospital encounter of 01/21/18 (from the past 48 hour(s))  Hemoglobin     Status: Abnormal   Collection Time: 01/21/18 11:45 AM  Result Value Ref Range   Hemoglobin 12.9 (L) 13.0 - 17.0 g/dL    Comment: Performed at [facility], [address] [place], [place] [address]   No results found.  Review of Systems  Musculoskeletal: Positive for joint pain.  All other systems reviewed and are negative.   Blood pressure 134/83, pulse 67, temperature 98.3 F (36.8 C), temperature source Oral, resp. rate (!) 22, height 6\' 1"  (1.854 m), weight 95.3 kg, SpO2 100 %. Physical Exam  Constitutional: He appears well-developed.  HENT:  Head: Normocephalic.  Eyes: Pupils are equal, round, and reactive to light.  Neck: Normal range of motion.  Cardiovascular: Normal rate.  Respiratory: Effort normal.  Neurological: He is  alert.  Skin: Skin is warm.  Psychiatric: He has a normal mood and affect.  Examination of the left shoulder demonstrates that the deltoid does fire even though it slightly weak.  He does have pain with internal and external rotation.  The shoulder is located.  Motor or sensory function to the left hand is intact in the radial pulses intact.  Assessment/Plan Impression is left shoulder instability with rotator cuff tear labral tear and lateral subluxation of the biceps tendon which is forcing the humeral head anteriorly.  Plan is arthroscopy with biceps tendon release capsular reattachment to the glenoid then open rotator cuff repair and biceps tenodesis.  The risk and benefits of the surgery are discussed with the patient including but limited to infection nerve vessel damage shoulder stiffness as well as potential for more surgery which would likely be manipulation in his case.  Patient understands the risk benefits and wishes to proceed.  All questions answered  Burnard BuntingG Scott Dean, MD 01/21/2018, 1:53 PM

## 2018-01-21 NOTE — Anesthesia Procedure Notes (Signed)
Procedure Name: Intubation Performed by: Milford Cage, CRNA Pre-anesthesia Checklist: Patient identified, Emergency Drugs available, Suction available and Patient being monitored Patient Re-evaluated:Patient Re-evaluated prior to induction Oxygen Delivery Method: Circle System Utilized Preoxygenation: Pre-oxygenation with 100% oxygen Induction Type: IV induction Ventilation: Mask ventilation without difficulty Laryngoscope Size: Mac and 3 Grade View: Grade II Tube type: Oral Number of attempts: 2 Airway Equipment and Method: Stylet and Oral airway Placement Confirmation: ETT inserted through vocal cords under direct vision,  positive ETCO2 and breath sounds checked- equal and bilateral Secured at: 24 cm Tube secured with: Tape Dental Injury: Teeth and Oropharynx as per pre-operative assessment  Comments: Recommend MAC 4 or Mil 3

## 2018-01-21 NOTE — Anesthesia Procedure Notes (Signed)
Anesthesia Regional Block: Interscalene brachial plexus block   Pre-Anesthetic Checklist: ,, timeout performed, Correct Patient, Correct Site, Correct Laterality, Correct Procedure, Correct Position, site marked, Risks and benefits discussed,  Surgical consent,  Pre-op evaluation,  At surgeon's request and post-op pain management  Laterality: Left  Prep: chloraprep       Needles:  Injection technique: Single-shot  Needle Type: Echogenic Stimulator Needle     Needle Length: 9cm  Needle Gauge: 21     Additional Needles:   Procedures:,,,, ultrasound used (permanent image in chart),,,,  Narrative:  Start time: 01/21/2018 1:15 PM End time: 01/21/2018 1:25 PM Injection made incrementally with aspirations every 5 mL.  Performed by: Personally  Anesthesiologist: Leonides GrillsEllender, Ryan P, MD  Additional Notes: Functioning IV was confirmed and monitors were applied.  A timeout was performed. Sterile prep, hand hygiene and sterile gloves were used. A 90mm 21ga Arrow echogenic stimulator needle was used. Negative aspiration and negative test dose prior to incremental administration of local anesthetic. The patient tolerated the procedure well.  Ultrasound guidance: relevent anatomy identified, needle position confirmed, local anesthetic spread visualized around nerve(s), vascular puncture avoided.  Image printed for medical record.

## 2018-01-21 NOTE — Progress Notes (Signed)
Security notified that patient has arrived.

## 2018-01-21 NOTE — Progress Notes (Signed)
Consent filled out and signed by Dr. August Saucerean. No orders in epic.

## 2018-01-21 NOTE — Telephone Encounter (Signed)
Fyi.  I spoke with Alvino ChapelJo and advised. She states that they do not have narcotics in the jail, nor a nurse at night. The plan for the patient is to be dosed with ibuprofen and tylenol at 9p prior to the nurse leaving. Then Alvino ChapelJo will be there early in the morning to dose with ibuprofen and tylenol. Rx will be taken to the pharmacy and they will be able to start pain medication once they receive it.

## 2018-01-22 ENCOUNTER — Encounter (HOSPITAL_COMMUNITY): Payer: Self-pay | Admitting: Orthopedic Surgery

## 2018-01-22 NOTE — Transfer of Care (Addendum)
Immediate Anesthesia Transfer of Care Note  Patient: Jeremy Levine  Procedure(s) Performed: LEFT SHOULDER ARTHROSCOPY WITH ROTATOR CUFF TEAR REPAIR, BICEP TENODESIS, WITH SUPERIOR LABRUM DEBRIDEMENT & REPAIR (Left Shoulder)  Patient Location: PACU  Anesthesia Type:GA combined with regional for post-op pain  Level of Consciousness: drowsy  Airway & Oxygen Therapy: Patient Spontanous Breathing and Patient connected to nasal cannula oxygen  Post-op Assessment: Report given to RN, Post -op Vital signs reviewed and stable and Patient moving all extremities  Post vital signs: Reviewed and stable  Last Vitals:  Vitals Value Taken Time  BP    Temp    Pulse    Resp    SpO2      Last Pain:  Vitals:   01/21/18 1830  TempSrc:   PainSc: 0-No pain         Complications: No apparent anesthesia complications

## 2018-01-22 NOTE — Anesthesia Postprocedure Evaluation (Signed)
Anesthesia Post Note  Patient: Jeremy Levine  Procedure(s) Performed: LEFT SHOULDER ARTHROSCOPY WITH ROTATOR CUFF TEAR REPAIR, BICEP TENODESIS, WITH SUPERIOR LABRUM DEBRIDEMENT & REPAIR (Left Shoulder)     Patient location during evaluation: PACU Anesthesia Type: Regional and General Level of consciousness: awake and alert Pain management: pain level controlled Vital Signs Assessment: post-procedure vital signs reviewed and stable Respiratory status: spontaneous breathing, nonlabored ventilation, respiratory function stable and patient connected to nasal cannula oxygen Cardiovascular status: blood pressure returned to baseline and stable Postop Assessment: no apparent nausea or vomiting Anesthetic complications: no    Last Vitals:  Vitals:   01/21/18 1826 01/21/18 1830  BP: (!) 133/93   Pulse:  87  Resp:  18  Temp:  (!) 36.1 C  SpO2:  100%    Last Pain:  Vitals:   01/21/18 1830  TempSrc:   PainSc: 0-No pain                 Zack Crager P Casmere Hollenbeck

## 2018-01-28 ENCOUNTER — Ambulatory Visit (INDEPENDENT_AMBULATORY_CARE_PROVIDER_SITE_OTHER): Payer: Self-pay | Admitting: Family Medicine

## 2018-01-28 ENCOUNTER — Encounter (INDEPENDENT_AMBULATORY_CARE_PROVIDER_SITE_OTHER): Payer: Self-pay | Admitting: Family Medicine

## 2018-01-28 DIAGNOSIS — S43015D Anterior dislocation of left humerus, subsequent encounter: Secondary | ICD-10-CM

## 2018-01-28 NOTE — Progress Notes (Signed)
   Office Visit Note   Patient: Jeremy Levine           Date of Birth: 1990/06/14           MRN: [id number] Visit Date: 01/28/2018 Requested by: No referring provider defined for this encounter. PCP: Patient, No Pcp Per  Subjective: Chief Complaint  Patient presents with  . Left Shoulder - Routine Post Op    7 days post shoulder arthroscopy/debridement/labrum repair. In arm sling. Removed impervious dressing. 3 sites with sutures intact. Steristrips intact over long incision.    HPI: He is about 1 week status post left shoulder rotator cuff and labral repair.  Also biceps tendon release and labral debridement.  Doing well, pain is well managed.  He is in his sling most of the time.              ROS: Noncontributory  Objective: Vital Signs: There were no vitals taken for this visit.  Physical Exam:  Left shoulder: Wounds look good, no sign of infection, no drainage.  Sutures removed and Steri-Strips applied.  No numbness in the deltoid area.  Did not put him through any active or passive range of motion.  Imaging: None today.  Assessment & Plan: 1.  Stable 1 week status post left shoulder rotator cuff and labrum repair -Gentle range of motion, pendulum exercises.  Follow-up in about 3 weeks with Dr. August Saucerean.   Follow-Up Instructions: Return in about 3 weeks (around 02/18/2018) for With Dr. August Saucerean.      Procedures: No procedures performed  No notes on file    PMFS History: Patient Active Problem List   Diagnosis Date Noted  . Left shoulder pain 09/01/2012  . Trigger point of left shoulder region 09/01/2012   Past Medical History:  Diagnosis Date  . Heart murmur     No family history on file.  Past Surgical History:  Procedure Laterality Date  . SHOULDER ARTHROSCOPY WITH SUBACROMIAL DECOMPRESSION, ROTATOR CUFF REPAIR AND BICEP TENDON REPAIR Left 01/21/2018   Procedure: LEFT SHOULDER ARTHROSCOPY WITH ROTATOR CUFF TEAR REPAIR, BICEP TENODESIS, WITH SUPERIOR LABRUM  DEBRIDEMENT & REPAIR;  Surgeon: Cammy Copaean, Gregory Scott, MD;  Location: [facility] OR;  Service: Orthopedics;  Laterality: Left;   Social History   Occupational History  . Not on file  Tobacco Use  . Smoking status: Never Smoker  . Smokeless tobacco: Never Used  Substance and Sexual Activity  . Alcohol use: No  . Drug use: Not Currently  . Sexual activity: Not on file

## 2018-02-09 DIAGNOSIS — S46112A Strain of muscle, fascia and tendon of long head of biceps, left arm, initial encounter: Secondary | ICD-10-CM

## 2018-02-09 DIAGNOSIS — S46011A Strain of muscle(s) and tendon(s) of the rotator cuff of right shoulder, initial encounter: Secondary | ICD-10-CM

## 2018-02-09 DIAGNOSIS — S43005D Unspecified dislocation of left shoulder joint, subsequent encounter: Secondary | ICD-10-CM

## 2018-02-17 ENCOUNTER — Ambulatory Visit (INDEPENDENT_AMBULATORY_CARE_PROVIDER_SITE_OTHER): Admitting: Family Medicine

## 2018-02-24 ENCOUNTER — Encounter (INDEPENDENT_AMBULATORY_CARE_PROVIDER_SITE_OTHER): Payer: Self-pay | Admitting: Orthopedic Surgery

## 2018-02-24 ENCOUNTER — Ambulatory Visit (INDEPENDENT_AMBULATORY_CARE_PROVIDER_SITE_OTHER): Admitting: Orthopedic Surgery

## 2018-02-24 DIAGNOSIS — S43015D Anterior dislocation of left humerus, subsequent encounter: Secondary | ICD-10-CM

## 2018-02-24 NOTE — Progress Notes (Signed)
   Post-Op Visit Note   Patient: Jeremy Levine           Date of Birth: [birth year]           MRN: [id number] Visit Date: 02/24/2018 PCP: Patient, No Pcp Per   Assessment & Plan:  Chief Complaint:  Chief Complaint  Patient presents with  . Left Shoulder - Follow-up, Pain   Visit Diagnoses:  1. Anterior dislocation of left shoulder, subsequent encounter     Plan: Jeremy Levine is now 34 days out left shoulder arthroscopy with rotator cuff repair and labral repair.  He has been doing his own exercises which is been pendulums as well as active assisted forward flexion.  On exam his axillary nerve is recovering reasonably well.  He is got about 80 degrees of forward flexion passively and 80 degrees of abduction passively.  External rotation is about 15 degrees.  I am going to get him started in physical therapy with active assisted motion and passive range of motion only for 2 weeks then I will see him back in 4 weeks.  Okay to DC sling but no lifting with that left arm.  Okay to walk with a regular inmates.  Follow-Up Instructions: Return in about 4 weeks (around 03/24/2018).   Orders:  No orders of the defined types were placed in this encounter.  No orders of the defined types were placed in this encounter.   Imaging: No results found.  PMFS History: Patient Active Problem List   Diagnosis Date Noted  . Shoulder dislocation, left, subsequent encounter   . Traumatic complete tear of right rotator cuff   . Labral tear of long head of left biceps tendon   . Left shoulder pain 09/01/2012  . Trigger point of left shoulder region 09/01/2012   Past Medical History:  Diagnosis Date  . Heart murmur     History reviewed. No pertinent family history.  Past Surgical History:  Procedure Laterality Date  . SHOULDER ARTHROSCOPY WITH SUBACROMIAL DECOMPRESSION, ROTATOR CUFF REPAIR AND BICEP TENDON REPAIR Left 01/21/2018   Procedure: LEFT SHOULDER ARTHROSCOPY WITH ROTATOR CUFF TEAR REPAIR,  BICEP TENODESIS, WITH SUPERIOR LABRUM DEBRIDEMENT & REPAIR;  Surgeon: Cammy Copa, MD;  Location: [facility] OR;  Service: Orthopedics;  Laterality: Left;   Social History   Occupational History  . Not on file  Tobacco Use  . Smoking status: Never Smoker  . Smokeless tobacco: Never Used  Substance and Sexual Activity  . Alcohol use: No  . Drug use: Not Currently  . Sexual activity: Not on file

## 2018-03-08 ENCOUNTER — Telehealth (INDEPENDENT_AMBULATORY_CARE_PROVIDER_SITE_OTHER): Payer: Self-pay | Admitting: Orthopedic Surgery

## 2018-03-08 NOTE — Telephone Encounter (Signed)
Jeremy Levine with [facility] PT called needing Op report and demographics faxed to her. Patient has an appointment at 2:00pm. The fax# is [phone] The ph# is [phone]

## 2018-03-08 NOTE — Telephone Encounter (Signed)
Demo and op report to [employer] at [phone]

## 2019-02-06 IMAGING — MR MR SHOULDER*L* W/O CM
4 of 6 series · 18 of 40 positions shown · non-contrast
Comparison: None.

CLINICAL DATA: Recurrent shoulder dislocations with left shoulder
pain.

EXAM:
MRI OF THE LEFT SHOULDER WITHOUT CONTRAST
TECHNIQUE: Multiplanar, multisequence MR imaging of the shoulder was performed.
No intravenous contrast was administered.

[Series 3: T2 fat-sat · axial · 4.0mm · 0.33mm/px · z∈[-17,+57]mm · 3 of 23 slices shown (1 of 2)]
[im 4/23]
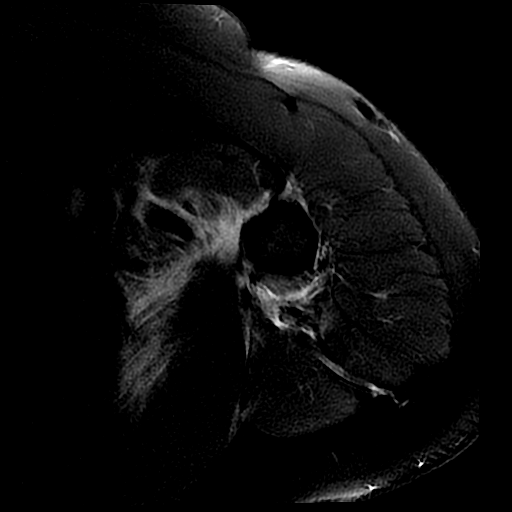
[im 12/23]
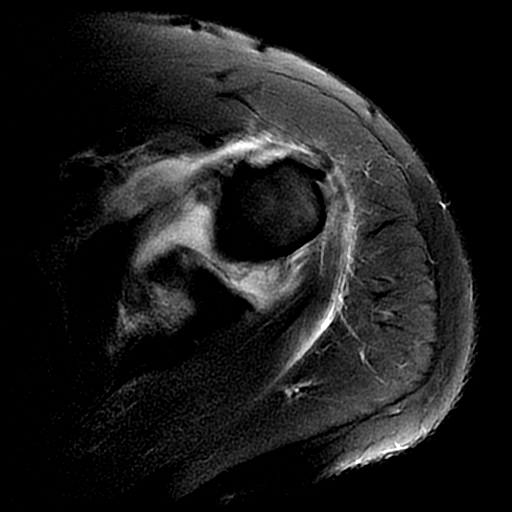
[im 19/23]
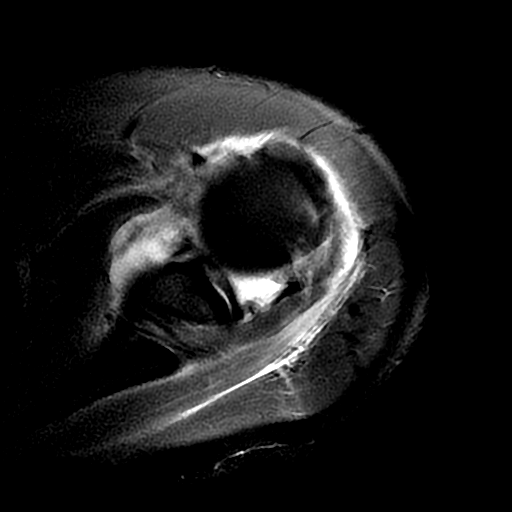

[Series 5: T2 fat-sat · coronal · 4.0mm · 0.29mm/px · 3 of 20 slices shown (2 of 2)]
[im 4/20]
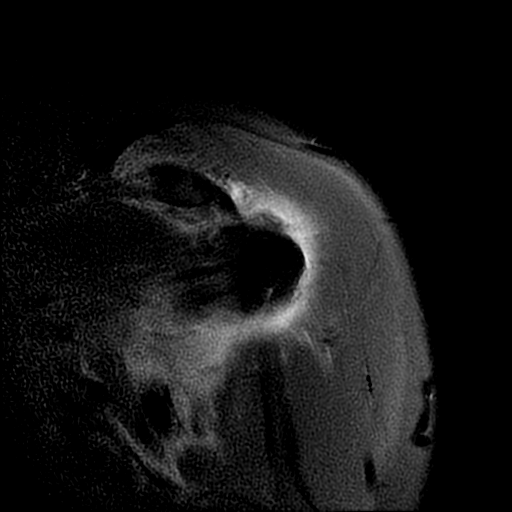
[im 12/20]
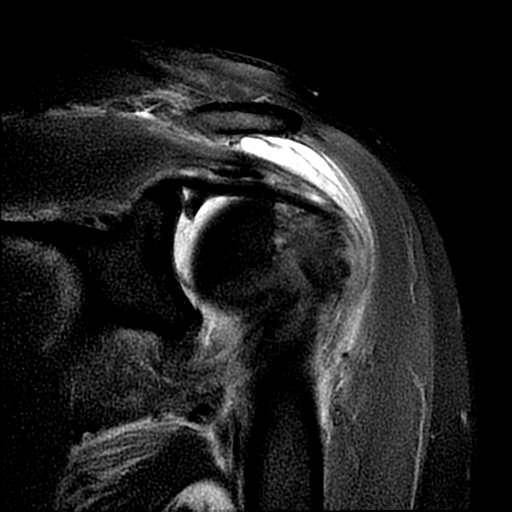
[im 20/20]
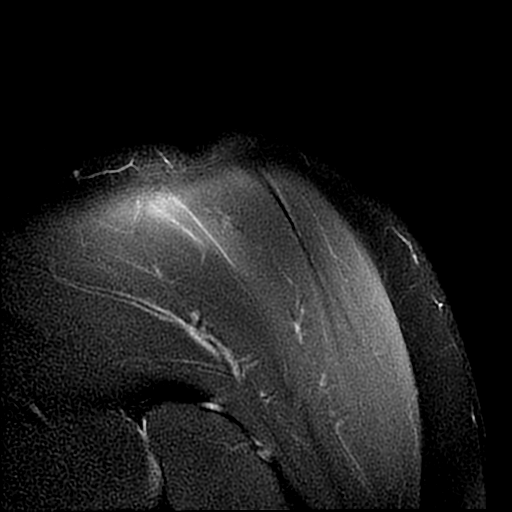

[Series 6: PD · axial · 4.0mm · 0.33mm/px · z∈[-31,+76]mm · 7 of 23 slices shown (1 of 2)]
[im 1/23]
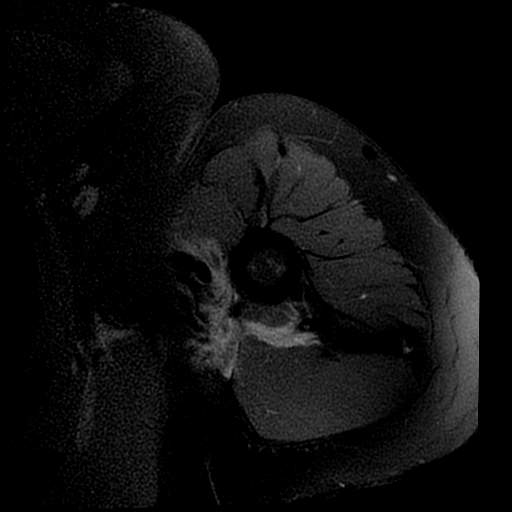
[im 4/23]
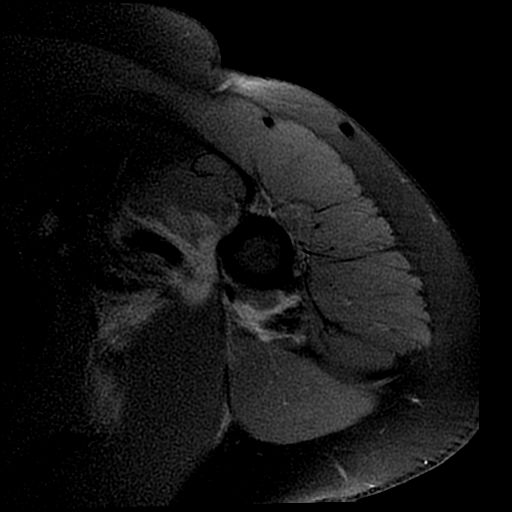
[im 8/23]
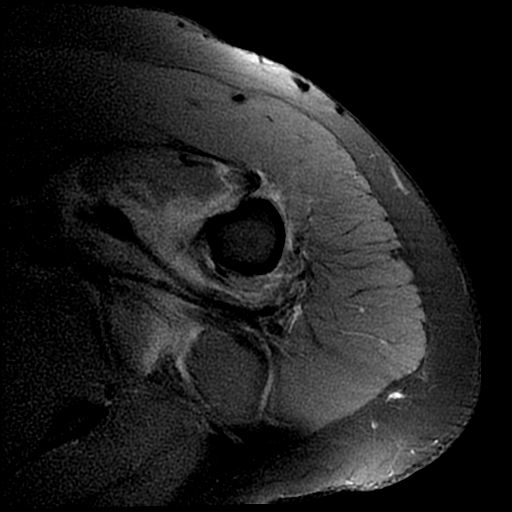
[im 12/23]
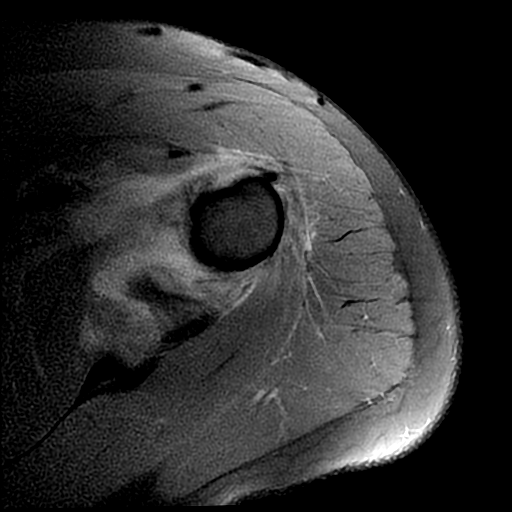
[im 15/23]
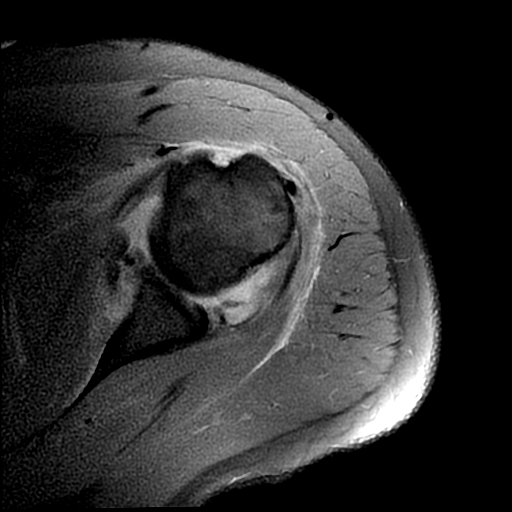
[im 19/23]
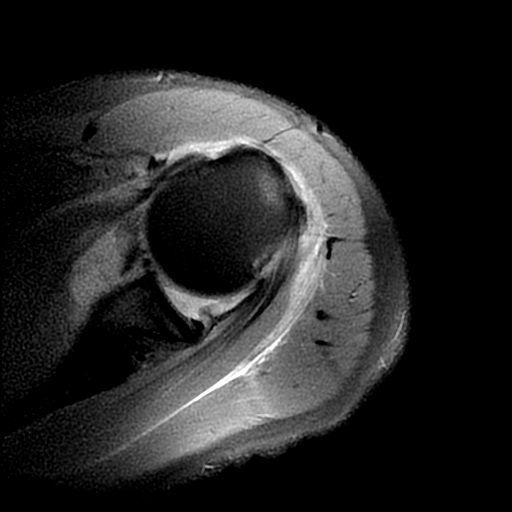
[im 23/23]
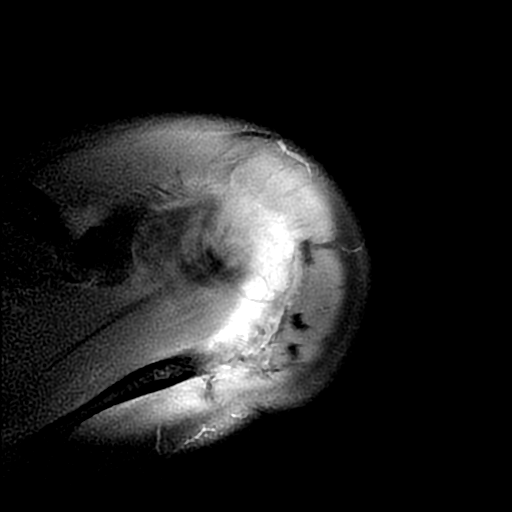

[Series 8: PD · coronal · 4.0mm · 0.29mm/px · 5 of 20 slices shown (2 of 2)]
[im 1/20]
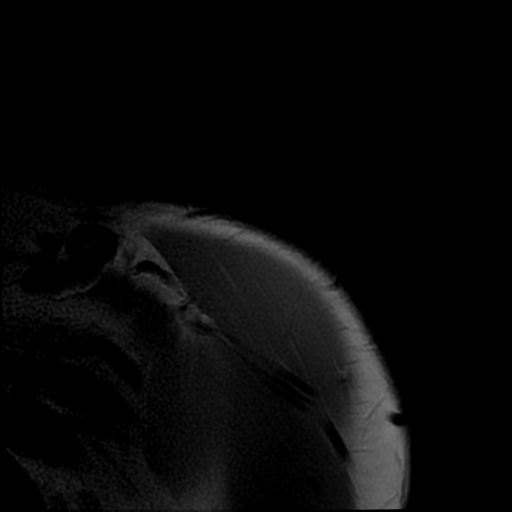
[im 4/20]
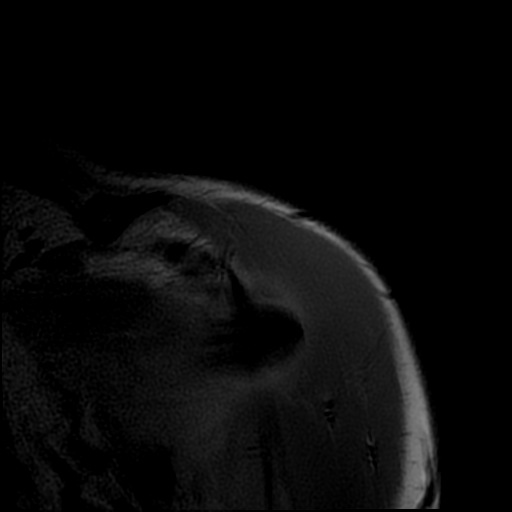
[im 8/20]
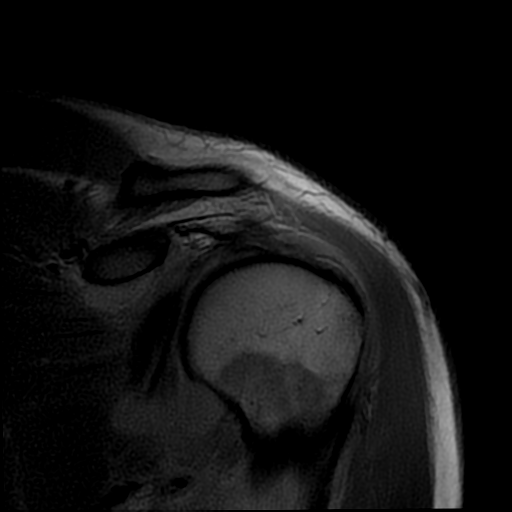
[im 12/20]
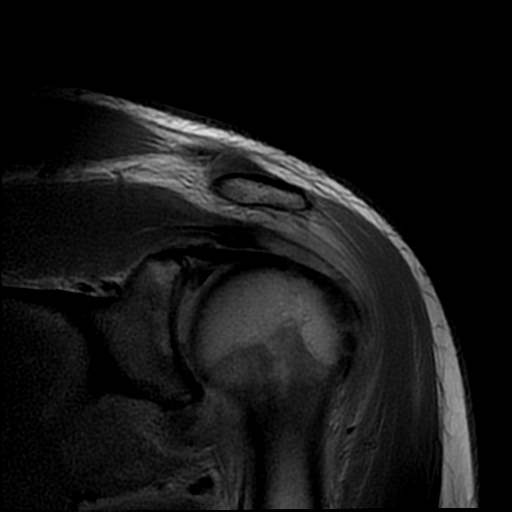
[im 20/20]
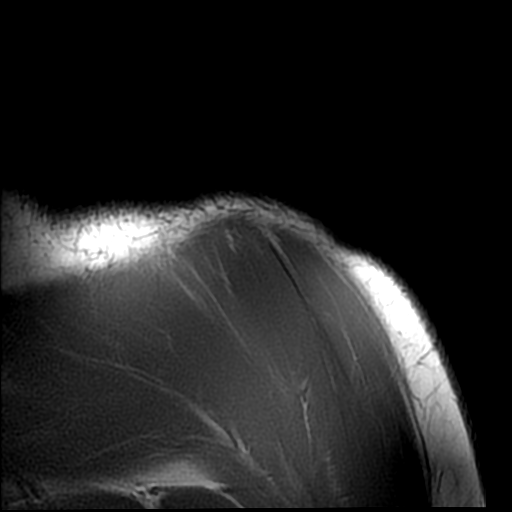

[18 of 40 positions shown; findings below may reference images not displayed]

FINDINGS: Rotator cuff: Articular surface delamination of the supraspinatus
tendon spanning a coronal length of 18 mm with frayed/retracted
fibers projecting into the glenohumeral joint, series [DATE].. The
subscapularis, infraspinatus and teres minor muscles and tendons are
nonacute allowing for limitations due to motion.

Muscles:  No muscle atrophy.

Biceps long head: Lateral dislocation of the vertical portion of the
biceps tendon from its biceps groove, series [DATE] for example.
Intact biceps anchor.

Acromioclavicular Joint: Mild arthropathy of the acromioclavicular
joint. Type I flat shaped acromion. Small amount of subacromial and
subdeltoid bursal fluid.

Glenohumeral Joint: Small joint effusion. Type 3 medial capsular
insertion approximately 19 mm from the anterior glenoid rim
compatible with capsular laxity, series [DATE].

Labrum: SLAP tear of the superior glenoid labrum, series 5/images 8
and 9. Suspect cartilaginous Bankart lesion anteriorly.

Bones: Anteriorly subluxed appearance of the humeral head relative
to the glenoid fossa with bone marrow edema of the posterolateral
humeral head in a pattern consistent with history of anterior
shoulder dislocation. Hill-Sachs deformity of the humeral head is
noted. No bony Bankart lesion is seen though there does appear to be

Other: None
IMPRESSION: 1. Lateral dislocation of the biceps tendon from its bicipital
groove.
2. Bone marrow edema pattern consistent with stigmata of anterior
humeral head dislocation with Hill-Sachs deformity and cartilaginous
Bankart lesion.
3. Delaminating articular surface tear of the supraspinatus tendon
with for 8 fragments projecting into the glenohumeral joint. This
may cause mechanical impedance to shoulder reduction.
4. Slap tear of the glenoid labrum.
5. Type 3 glenohumeral capsular insertion likely contributing to
joint laxity.
